# Patient Record
Sex: Female | Born: 1955 | Race: Black or African American | Hispanic: No | Marital: Single | Smoking: Former smoker
Health system: Southern US, Community
[De-identification: ages and names within clinical notes are randomized; demographics above are authoritative.]

## PROBLEM LIST (undated history)

## (undated) DIAGNOSIS — E785 Hyperlipidemia, unspecified: Secondary | ICD-10-CM

## (undated) HISTORY — DX: Hyperlipidemia, unspecified: E78.5

---

## 2010-09-09 ENCOUNTER — Other Ambulatory Visit: Payer: Self-pay | Admitting: Family Medicine

## 2010-09-09 DIAGNOSIS — Z1239 Encounter for other screening for malignant neoplasm of breast: Secondary | ICD-10-CM

## 2010-09-18 ENCOUNTER — Ambulatory Visit
Admission: RE | Admit: 2010-09-18 | Discharge: 2010-09-18 | Disposition: A | Discharge status: Home / Self Care | Payer: Private Health Insurance - Indemnity | Source: Ambulatory Visit | Attending: Family Medicine | Admitting: Family Medicine

## 2010-09-18 DIAGNOSIS — Z1239 Encounter for other screening for malignant neoplasm of breast: Secondary | ICD-10-CM

## 2011-03-12 ENCOUNTER — Encounter: Payer: Self-pay | Admitting: Gastroenterology

## 2011-04-21 ENCOUNTER — Other Ambulatory Visit: Payer: Private Health Insurance - Indemnity | Admitting: Gastroenterology

## 2011-04-23 ENCOUNTER — Ambulatory Visit (AMBULATORY_SURGERY_CENTER): Payer: Private Health Insurance - Indemnity

## 2011-04-23 VITALS — Ht 62.0 in | Wt 154.1 lb

## 2011-04-23 DIAGNOSIS — Z1211 Encounter for screening for malignant neoplasm of colon: Secondary | ICD-10-CM

## 2011-04-23 MED ORDER — SUPREP BOWEL PREP KIT 17.5-3.13-1.6 GM/177ML PO SOLN: 1.0000 | Freq: Once | ORAL | Status: DC

## 2011-04-26 ENCOUNTER — Encounter: Payer: Self-pay | Admitting: Gastroenterology

## 2011-05-07 ENCOUNTER — Ambulatory Visit (AMBULATORY_SURGERY_CENTER): Payer: Private Health Insurance - Indemnity | Admitting: Gastroenterology

## 2011-05-07 ENCOUNTER — Encounter: Payer: Self-pay | Admitting: Gastroenterology

## 2011-05-07 VITALS — BP 117/76 | HR 83 | Temp 98.1°F | Resp 18 | Ht 62.0 in | Wt 154.0 lb

## 2011-05-07 DIAGNOSIS — Z1211 Encounter for screening for malignant neoplasm of colon: Secondary | ICD-10-CM

## 2011-05-07 MED ORDER — SODIUM CHLORIDE 0.9 % IV SOLN: 500.0000 mL | INTRAVENOUS | Status: DC

## 2011-05-07 NOTE — Patient Instructions (Signed)
Discharge instructions given with verbal understanding. Normal. Resume previous medications. 

## 2011-05-10 ENCOUNTER — Telehealth: Payer: Self-pay

## 2011-05-10 NOTE — Telephone Encounter (Signed)
No ID on answering machine. 

## 2011-10-13 ENCOUNTER — Other Ambulatory Visit: Payer: Self-pay | Admitting: Family Medicine

## 2011-10-13 DIAGNOSIS — Z1231 Encounter for screening mammogram for malignant neoplasm of breast: Secondary | ICD-10-CM

## 2011-10-26 ENCOUNTER — Ambulatory Visit
Admission: RE | Admit: 2011-10-26 | Discharge: 2011-10-26 | Disposition: A | Discharge status: Home / Self Care | Payer: Private Health Insurance - Indemnity | Source: Ambulatory Visit | Attending: Family Medicine | Admitting: Family Medicine

## 2011-10-26 DIAGNOSIS — Z1231 Encounter for screening mammogram for malignant neoplasm of breast: Secondary | ICD-10-CM

## 2012-12-04 ENCOUNTER — Other Ambulatory Visit: Payer: Self-pay

## 2012-12-04 DIAGNOSIS — Z1231 Encounter for screening mammogram for malignant neoplasm of breast: Secondary | ICD-10-CM

## 2012-12-27 ENCOUNTER — Ambulatory Visit
Admission: RE | Admit: 2012-12-27 | Discharge: 2012-12-27 | Disposition: A | Discharge status: Home / Self Care | Payer: BC Managed Care – PPO | Source: Ambulatory Visit

## 2012-12-27 DIAGNOSIS — Z1231 Encounter for screening mammogram for malignant neoplasm of breast: Secondary | ICD-10-CM

## 2014-01-01 ENCOUNTER — Other Ambulatory Visit: Payer: Self-pay

## 2014-01-01 DIAGNOSIS — Z1231 Encounter for screening mammogram for malignant neoplasm of breast: Secondary | ICD-10-CM

## 2014-01-03 ENCOUNTER — Ambulatory Visit
Admission: RE | Admit: 2014-01-03 | Discharge: 2014-01-03 | Disposition: A | Discharge status: Home / Self Care | Payer: BC Managed Care – PPO | Source: Ambulatory Visit

## 2014-01-03 DIAGNOSIS — Z1231 Encounter for screening mammogram for malignant neoplasm of breast: Secondary | ICD-10-CM

## 2014-12-25 ENCOUNTER — Other Ambulatory Visit: Payer: Self-pay

## 2014-12-25 DIAGNOSIS — Z1231 Encounter for screening mammogram for malignant neoplasm of breast: Secondary | ICD-10-CM

## 2015-01-22 ENCOUNTER — Ambulatory Visit
Admission: RE | Admit: 2015-01-22 | Discharge: 2015-01-22 | Disposition: A | Discharge status: Home / Self Care | Payer: BLUE CROSS/BLUE SHIELD | Source: Ambulatory Visit

## 2015-01-22 DIAGNOSIS — Z1231 Encounter for screening mammogram for malignant neoplasm of breast: Secondary | ICD-10-CM

## 2016-01-13 ENCOUNTER — Other Ambulatory Visit: Payer: Self-pay

## 2016-01-13 DIAGNOSIS — Z1231 Encounter for screening mammogram for malignant neoplasm of breast: Secondary | ICD-10-CM

## 2016-01-23 ENCOUNTER — Ambulatory Visit: Payer: BLUE CROSS/BLUE SHIELD

## 2016-01-23 ENCOUNTER — Ambulatory Visit
Admission: RE | Admit: 2016-01-23 | Discharge: 2016-01-23 | Disposition: A | Discharge status: Home / Self Care | Payer: 59 | Source: Ambulatory Visit

## 2016-01-23 DIAGNOSIS — Z1231 Encounter for screening mammogram for malignant neoplasm of breast: Secondary | ICD-10-CM

## 2017-03-10 ENCOUNTER — Other Ambulatory Visit: Payer: Self-pay | Admitting: Family Medicine

## 2017-03-10 DIAGNOSIS — Z1231 Encounter for screening mammogram for malignant neoplasm of breast: Secondary | ICD-10-CM

## 2017-03-28 ENCOUNTER — Ambulatory Visit: Payer: 59

## 2017-04-07 ENCOUNTER — Ambulatory Visit
Admission: RE | Admit: 2017-04-07 | Discharge: 2017-04-07 | Disposition: A | Discharge status: Home / Self Care | Payer: 59 | Source: Ambulatory Visit | Attending: Family Medicine | Admitting: Family Medicine

## 2017-04-07 DIAGNOSIS — Z1231 Encounter for screening mammogram for malignant neoplasm of breast: Secondary | ICD-10-CM

## 2018-07-19 ENCOUNTER — Other Ambulatory Visit: Payer: Self-pay | Admitting: Family Medicine

## 2018-07-19 DIAGNOSIS — Z1231 Encounter for screening mammogram for malignant neoplasm of breast: Secondary | ICD-10-CM

## 2018-07-27 ENCOUNTER — Ambulatory Visit
Admission: RE | Admit: 2018-07-27 | Discharge: 2018-07-27 | Disposition: A | Discharge status: Home / Self Care | Payer: 59 | Source: Ambulatory Visit | Attending: Family Medicine | Admitting: Family Medicine

## 2018-07-27 DIAGNOSIS — Z1231 Encounter for screening mammogram for malignant neoplasm of breast: Secondary | ICD-10-CM

## 2019-06-22 ENCOUNTER — Other Ambulatory Visit: Payer: Self-pay | Admitting: Family Medicine

## 2019-06-22 DIAGNOSIS — Z1231 Encounter for screening mammogram for malignant neoplasm of breast: Secondary | ICD-10-CM

## 2019-08-27 ENCOUNTER — Ambulatory Visit
Admission: RE | Admit: 2019-08-27 | Discharge: 2019-08-27 | Disposition: A | Discharge status: Home / Self Care | Payer: 59 | Source: Ambulatory Visit | Attending: Family Medicine | Admitting: Family Medicine

## 2019-08-27 ENCOUNTER — Other Ambulatory Visit: Payer: Self-pay

## 2019-08-27 DIAGNOSIS — Z1231 Encounter for screening mammogram for malignant neoplasm of breast: Secondary | ICD-10-CM

## 2019-11-01 ENCOUNTER — Ambulatory Visit: Payer: 59 | Attending: Family

## 2019-11-01 DIAGNOSIS — Z23 Encounter for immunization: Secondary | ICD-10-CM

## 2019-11-01 NOTE — Progress Notes (Signed)
   Covid-19 Vaccination Clinic  Name:  Bethany Bush    MRN: [id number] DOB: [birth year]  11/01/2019  [name] was observed post Covid-19 immunization for 15 minutes without incident. She was provided with Vaccine Information Sheet and instruction to access the V-Safe system.   [name] was instructed to call 911 with any severe reactions post vaccine: Marland Kitchen Difficulty breathing  . Swelling of face and throat  . A fast heartbeat  . A bad rash all over body  . Dizziness and weakness   Immunizations Administered    Name Date Dose VIS Date Route   Moderna COVID-19 Vaccine 11/01/2019  3:16 PM 0.5 mL 07/17/2019 Intramuscular   Manufacturer: Moderna   Lot: [id number]   NDC: 48350-757-32

## 2019-12-04 ENCOUNTER — Ambulatory Visit: Payer: 59 | Attending: Family

## 2019-12-04 DIAGNOSIS — Z23 Encounter for immunization: Secondary | ICD-10-CM

## 2019-12-04 NOTE — Progress Notes (Signed)
   Covid-19 Vaccination Clinic  Name:  Bethany Bush    MRN: [id number] DOB: 04/15/1956  12/04/2019  [name] was observed post Covid-19 immunization for 15 minutes without incident. She was provided with Vaccine Information Sheet and instruction to access the V-Safe system.   [name] was instructed to call 911 with any severe reactions post vaccine: Marland Kitchen Difficulty breathing  . Swelling of face and throat  . A fast heartbeat  . A bad rash all over body  . Dizziness and weakness   Immunizations Administered    Name Date Dose VIS Date Route   Moderna COVID-19 Vaccine 12/04/2019  2:08 PM 0.5 mL 07/2019 Intramuscular   Manufacturer: Moderna   Lot: [id number]   NDC: 90903-014-99

## 2020-09-22 ENCOUNTER — Other Ambulatory Visit: Payer: Self-pay | Admitting: Family Medicine

## 2020-09-22 DIAGNOSIS — Z1231 Encounter for screening mammogram for malignant neoplasm of breast: Secondary | ICD-10-CM

## 2020-11-07 ENCOUNTER — Ambulatory Visit: Payer: 59

## 2021-01-20 ENCOUNTER — Ambulatory Visit
Admission: RE | Admit: 2021-01-20 | Discharge: 2021-01-20 | Disposition: A | Discharge status: Home / Self Care | Payer: 59 | Source: Ambulatory Visit | Attending: Family Medicine | Admitting: Family Medicine

## 2021-01-20 ENCOUNTER — Other Ambulatory Visit: Payer: Self-pay

## 2021-01-20 DIAGNOSIS — Z1231 Encounter for screening mammogram for malignant neoplasm of breast: Secondary | ICD-10-CM

## 2021-01-24 IMAGING — MG DIGITAL SCREENING BILAT W/ TOMO W/ CAD
8 series · 8 of 24 positions shown · non-contrast
Comparison: Previous exam(s).

CLINICAL DATA: Screening.

EXAM:
DIGITAL SCREENING BILATERAL MAMMOGRAM WITH TOMO AND CAD

[R CC synth-2D]
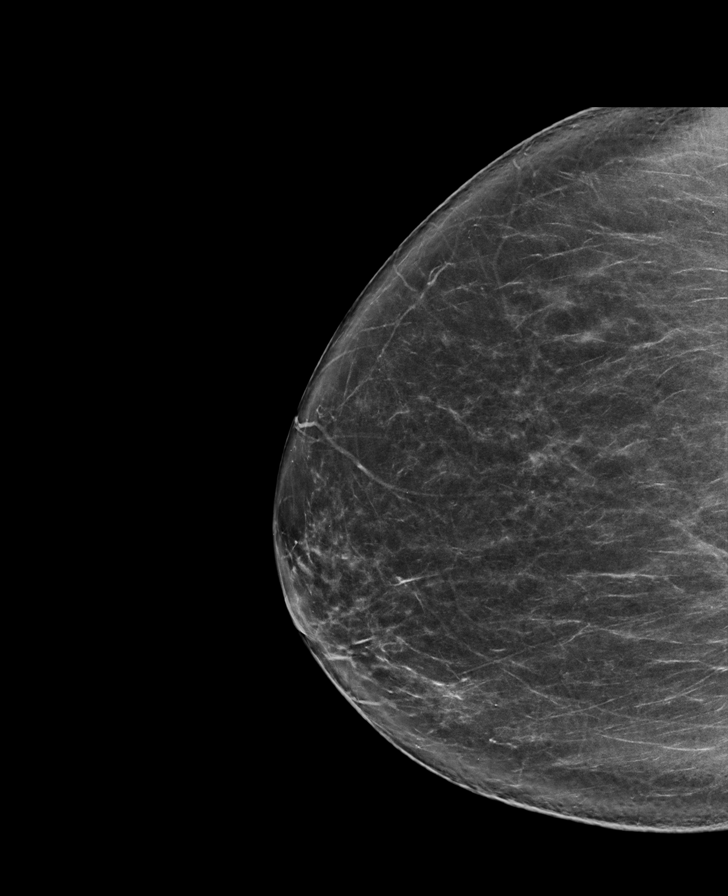

[L CC synth-2D]
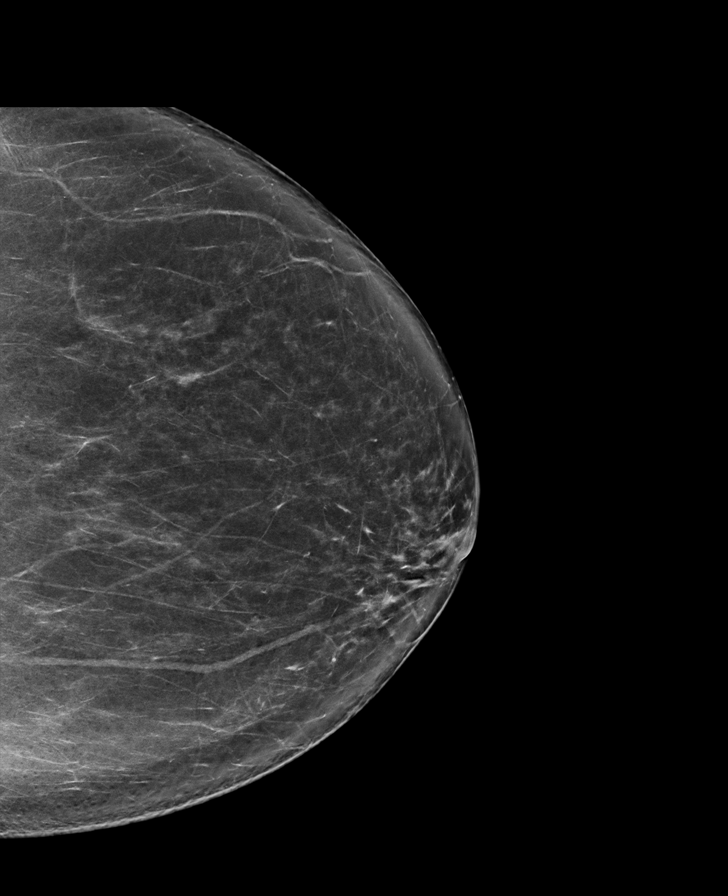

[L MLO synth-2D]
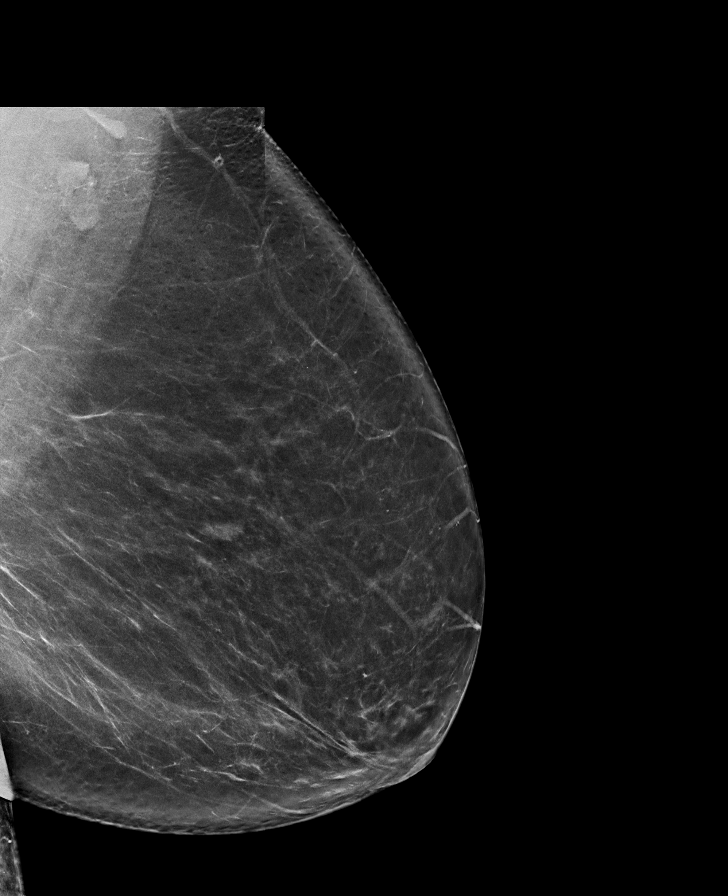

[R MLO synth-2D]
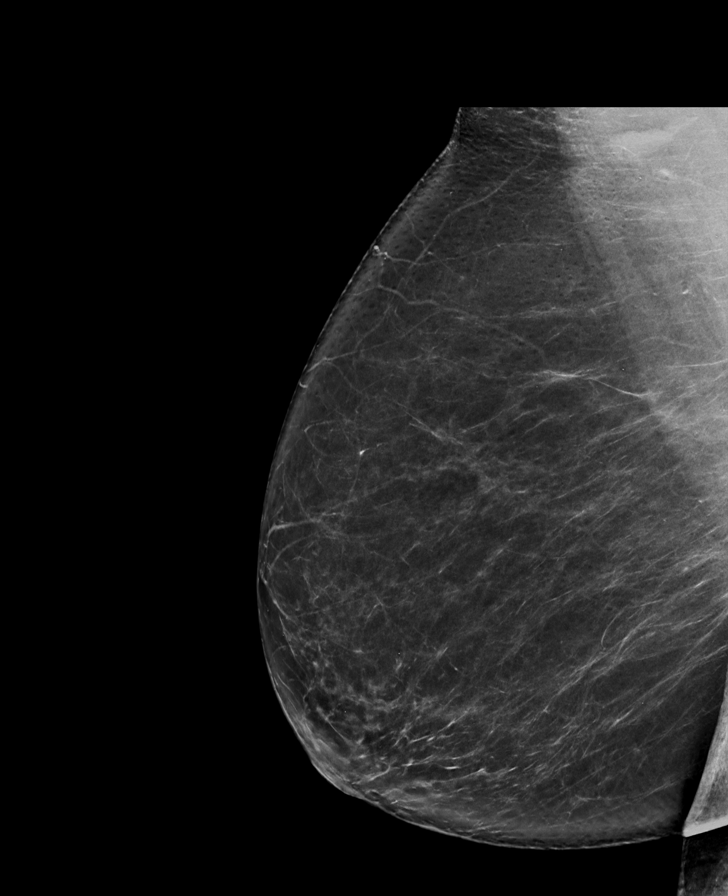

[L CC tomo · tomo slice 41/82.0]
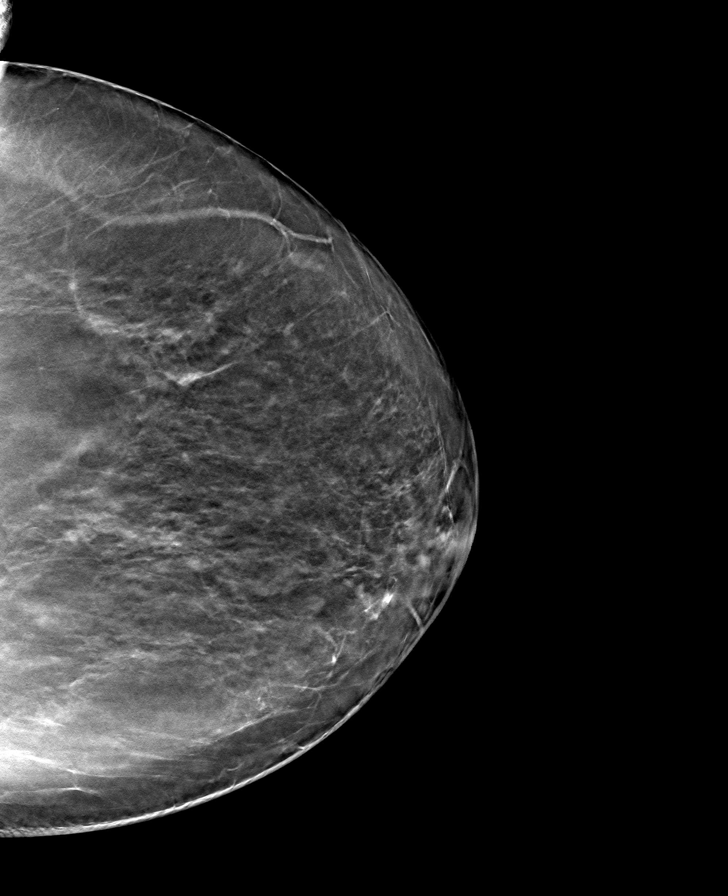

[L MLO tomo · tomo slice 47/92.0]
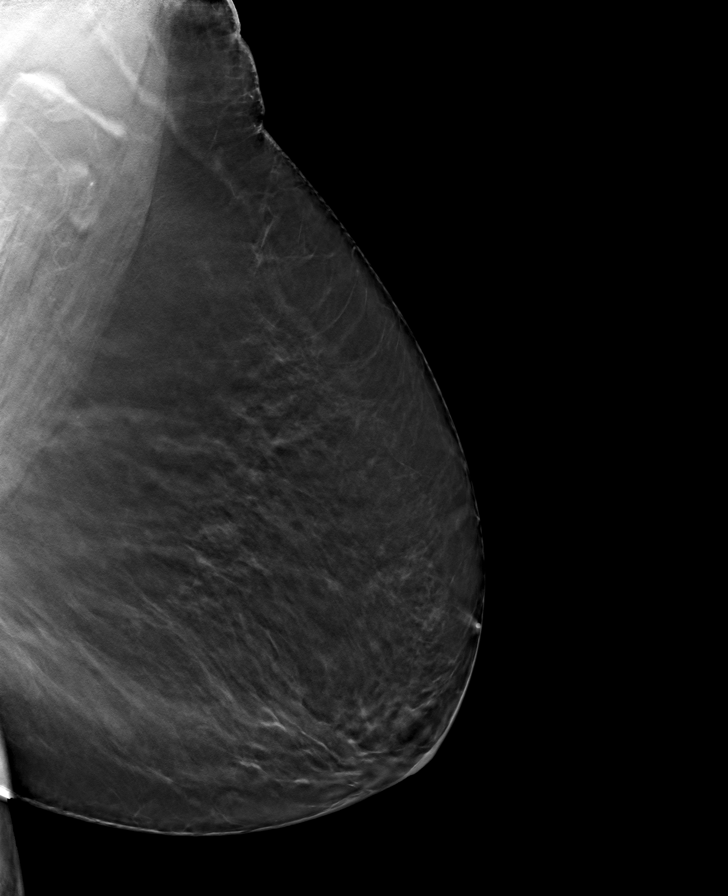

[R CC tomo · tomo slice 43/85.0]
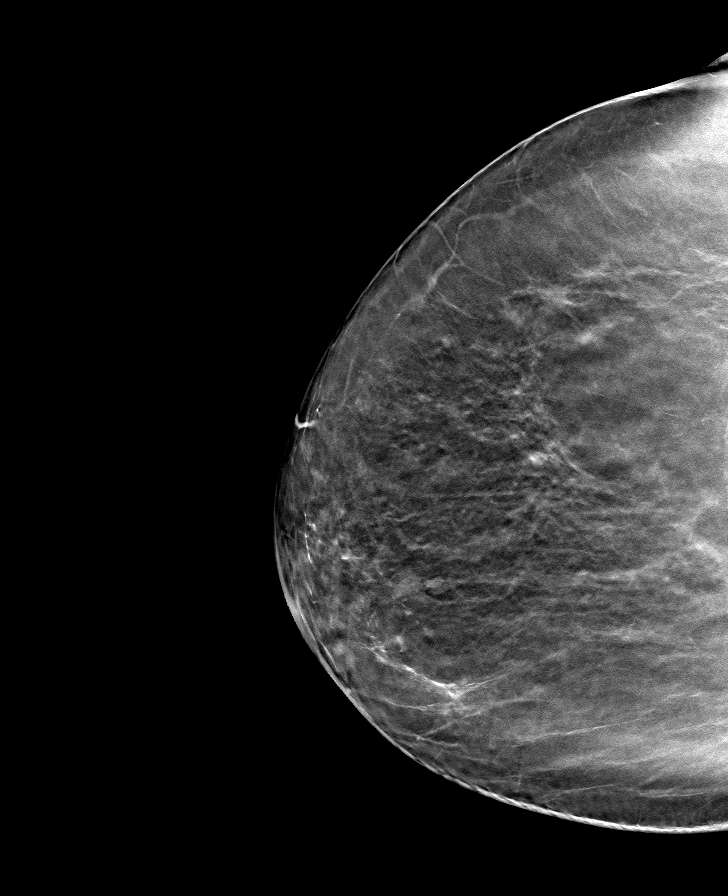

[R MLO tomo · tomo slice 47/92.0]
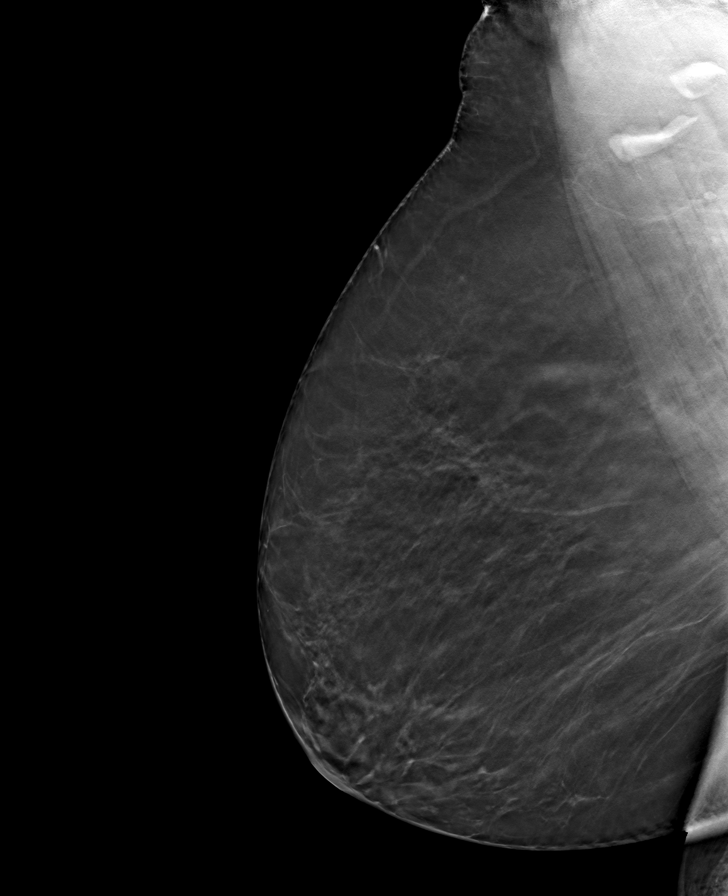

[8 of 24 positions shown; findings below may reference images not displayed]

ACR Breast Density Category b: There are scattered areas of
fibroglandular density.
FINDINGS: There are no findings suspicious for malignancy. Images were
processed with CAD.
IMPRESSION: No mammographic evidence of malignancy. A result letter of this
screening mammogram will be mailed directly to the patient.

RECOMMENDATION:
Screening mammogram in one year. (Code:[id number])

BI-RADS CATEGORY  1: Negative.

## 2021-03-12 ENCOUNTER — Other Ambulatory Visit: Payer: Self-pay | Admitting: Family Medicine

## 2021-03-12 ENCOUNTER — Ambulatory Visit
Admission: RE | Admit: 2021-03-12 | Discharge: 2021-03-12 | Disposition: A | Discharge status: Home / Self Care | Payer: 59 | Source: Ambulatory Visit | Attending: Family Medicine | Admitting: Family Medicine

## 2021-03-12 DIAGNOSIS — R52 Pain, unspecified: Secondary | ICD-10-CM

## 2021-05-26 ENCOUNTER — Encounter: Payer: Self-pay | Admitting: Gastroenterology

## 2022-05-13 ENCOUNTER — Other Ambulatory Visit: Payer: Self-pay | Admitting: Family Medicine

## 2022-05-13 DIAGNOSIS — Z1231 Encounter for screening mammogram for malignant neoplasm of breast: Secondary | ICD-10-CM

## 2022-05-24 ENCOUNTER — Ambulatory Visit
Admission: RE | Admit: 2022-05-24 | Discharge: 2022-05-24 | Disposition: A | Discharge status: Home / Self Care | Payer: 59 | Source: Ambulatory Visit | Attending: Family Medicine | Admitting: Family Medicine

## 2022-05-24 DIAGNOSIS — Z1231 Encounter for screening mammogram for malignant neoplasm of breast: Secondary | ICD-10-CM

## 2024-08-30 ENCOUNTER — Other Ambulatory Visit: Payer: Self-pay | Admitting: Family Medicine

## 2024-08-30 DIAGNOSIS — N644 Mastodynia: Secondary | ICD-10-CM

## 2024-08-30 DIAGNOSIS — Z1231 Encounter for screening mammogram for malignant neoplasm of breast: Secondary | ICD-10-CM

## 2024-09-06 ENCOUNTER — Ambulatory Visit: Admission: RE | Admit: 2024-09-06 | Source: Ambulatory Visit

## 2024-09-06 ENCOUNTER — Ambulatory Visit
Admission: RE | Admit: 2024-09-06 | Discharge: 2024-09-06 | Disposition: A | Discharge status: Home / Self Care | Source: Ambulatory Visit | Attending: Family Medicine | Admitting: Family Medicine

## 2024-09-06 DIAGNOSIS — N644 Mastodynia: Secondary | ICD-10-CM

## 2024-09-07 ENCOUNTER — Encounter: Attending: Family Medicine | Admitting: Dietician

## 2024-09-07 ENCOUNTER — Encounter: Payer: Self-pay | Admitting: Dietician

## 2024-09-07 VITALS — Ht 62.0 in | Wt 155.0 lb

## 2024-09-07 DIAGNOSIS — E119 Type 2 diabetes mellitus without complications: Secondary | ICD-10-CM | POA: Diagnosis present

## 2024-09-07 NOTE — Patient Instructions (Addendum)
 Consider falling asleep in your bed for best quality sleep.  Aim to take your medications consistently.  Determine what interferes with you taking your medication consistently.    Eye exam  Mindfulness:  Consistently scheduled meal - avoid skipping  Choices  Eat slowly  Away from distraction (sitting in kitchen or dining room)  Stop eating when satisfied  Before a snack ask, Am I hungry or eating for another reason?   What can I do instead if I am not hungry?  Try to find something every day that brings you joy!

## 2024-09-07 NOTE — Progress Notes (Signed)
 "  Diabetes Self-Management Education  Visit Type: First/Initial  Appt. Start Time: 1530 Appt. End Time: 1640  09/07/2024  [name], identified by name and date of birth, is a 69 y.o. female with a diagnosis of Diabetes: Type 2.   ASSESSMENT Patient is here today alone.  She would like to learn how to lower her cholesterol, lose a little weight, find balance in her eating and learn healthier alternatives.  She is realizing as she ages, that her taste buds are changing.  Referral:  HTN, HLD, Type 2  History includes:  Type 2 Diabetes (2012), HTN, HLD Medications include:  Toujeo 40 units q HS, Jardiance, Metformin, insomnia, HTN (patient states that she does not have HTN) Labs:  A1C 7.2% 09/05/2024 and states her last A1C was 12-14% Sleep:  most likely will sleep in her recliner rather than the bed then will wake and not be able to get back to sleep. CGM:  Libre  135 currently  CGM Results from download: 09/07/2024  % Time CGM active:   89 %   (Goal >70%)  Average glucose:   154 mg/dL for 14 days  Glucose management indicator:   7 %  Time in range (70-180 mg/dL):   80 %   (Goal >29%)  Time High (181-250 mg/dL):   20 %   (Goal < 74%)  Time Very High (>250 mg/dL):    0 %   (Goal < 5%)  Time Low (54-69 mg/dL):   0 %   (Goal <5%)  Time Very Low (<54 mg/dL):   0%   (Goal <8%)  %CV (glucose variability)     %  (Goal <36%)   62 155 lbs 09/08/2023 140-145 lbs 2012  Patient lives alone. 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 She has been walking about 20 minutes most every day but has a hard time being consistent.   She states that she likes sweets and fried foods.  She does not like to cook much anymore.  Appetite is lower. Eats dinner late (8-10 pm)  Height 5' 2 (1.575 m), weight 155 lb (70.3 kg), last menstrual period 03/16/2010. Body mass index is 28.35 kg/m.   Diabetes Self-Management Education - 09/07/24 1333       Visit Information    Visit Type First/Initial      Initial Visit   Diabetes Type Type 2    Date Diagnosed 2012    Are you currently following a meal plan? No    Are you taking your medications as prescribed? No   misses at times     Health Coping   How would you rate your overall health? Good      Psychosocial Assessment   Patient Belief/Attitude about Diabetes Motivated to manage diabetes    What is the hardest part about your diabetes right now, causing you the most concern, or is the most worrisome to you about your diabetes?   Making healty food and beverage choices    Self-care barriers None    Self-management support Doctor's office    Other persons present Patient    Patient Concerns Nutrition/Meal planning;Healthy Lifestyle;Glycemic Control;Weight Control    Special Needs None    Preferred Learning Style Hands on    Learning Readiness Ready    How often do you need to have someone help you when you read instructions, pamphlets, or other written materials from your doctor or pharmacy? 1 - Never    What  is the last grade level you completed in school? Master's degree      Pre-Education Assessment   Patient understands the diabetes disease and treatment process. Needs Review    Patient understands incorporating nutritional management into lifestyle. Needs Review    Patient undertands incorporating physical activity into lifestyle. Needs Review    Patient understands using medications safely. Needs Review    Patient understands monitoring blood glucose, interpreting and using results Needs Review    Patient understands prevention, detection, and treatment of acute complications. Needs Review    Patient understands prevention, detection, and treatment of chronic complications. Needs Review    Patient understands how to develop strategies to address psychosocial issues. Needs Review    Patient understands how to develop strategies to promote health/change behavior. Needs Review      Complications    Last HgB A1C per patient/outside source 7.2 %   per patient down from 12-14%   How often do you check your blood sugar? > 4 times/day    Fasting Blood glucose range (mg/dL) 29-870;869-820   11-859   Postprandial Blood glucose range (mg/dL) 869-820;819-799    Number of hypoglycemic episodes per month 0    Number of hyperglycemic episodes ( >200mg /dL): Weekly    Can you tell when your blood sugar is high? No    Have you had a dilated eye exam in the past 12 months? No    Have you had a dental exam in the past 12 months? Yes    Are you checking your feet? Yes    How many days per week are you checking your feet? 3      Dietary Intake   Breakfast egg sandwich with mayo on sourdough bread, orange, herb tea with splenda    Snack (morning) yogurt and nuts OR cheez its    Lunch none and does    Snack (afternoon) peanuts, dark chocolate OR yogurt and nuts OR cheez its    Dinner 1/4 pork steak, fried apples, turnip greens, cabbage, cornbread    Snack (evening) nuts, grapes    Beverage(s) water, herbal tea, black tea, occasional diet soda, Ryze mushroom coffee      Activity / Exercise   Activity / Exercise Type Light (walking / raking leaves)    How many days per week do you exercise? 5    How many minutes per day do you exercise? 20    Total minutes per week of exercise 100      Patient Education   Previous Diabetes Education Yes   when diagnosed 2012   Disease Pathophysiology Definition of diabetes, type 1 and 2, and the diagnosis of diabetes;Explored patient's options for treatment of their diabetes    Healthy Eating Role of diet in the treatment of diabetes and the relationship between the three main macronutrients and blood glucose level;Food label reading, portion sizes and measuring food.;Plate Method;Information on hints to eating out and maintain blood glucose control.;Meal options for control of blood glucose level and chronic complications.;Reviewed blood glucose goals for pre and  post meals and how to evaluate the patients' food intake on their blood glucose level.;Effects of alcohol on blood glucose and safety factors with consumption of alcohol.    Being Active Role of exercise on diabetes management, blood pressure control and cardiac health.    Medications Reviewed patients medication for diabetes, action, purpose, timing of dose and side effects.    Monitoring Taught/evaluated CGM (comment);Identified appropriate SMBG and/or A1C goals.;Daily foot exams;Yearly dilated  eye exam    Acute complications Taught prevention, symptoms, and  treatment of hypoglycemia - the 15 rule.;Discussed and identified patients' prevention, symptoms, and treatment of hyperglycemia.    Chronic complications Relationship between chronic complications and blood glucose control    Diabetes Stress and Support Identified and addressed patients feelings and concerns about diabetes;Worked with patient to identify barriers to care and solutions;Role of stress on diabetes      Individualized Goals (developed by patient)   Nutrition General guidelines for healthy choices and portions discussed    Physical Activity Exercise 5-7 days per week;30 minutes per day    Medications take my medication as prescribed    Monitoring  Consistenly use CGM    Problem Solving Eating Pattern;Addressing barriers to behavior change    Reducing Risk examine blood glucose patterns;do foot checks daily;treat hypoglycemia with 15 grams of carbs if blood glucose less than 70mg /dL      Post-Education Assessment   Patient understands the diabetes disease and treatment process. Demonstrates understanding / competency    Patient understands incorporating nutritional management into lifestyle. Needs Review    Patient undertands incorporating physical activity into lifestyle. Comprehends key points    Patient understands using medications safely. Comphrehends key points    Patient understands monitoring blood glucose,  interpreting and using results Comprehends key points    Patient understands prevention, detection, and treatment of acute complications. Comprehends key points    Patient understands prevention, detection, and treatment of chronic complications. Comprehends key points    Patient understands how to develop strategies to address psychosocial issues. Comprehends key points    Patient understands how to develop strategies to promote health/change behavior. Needs Review      Outcomes   Expected Outcomes Demonstrated interest in learning. Expect positive outcomes    Future DMSE 2 months    Program Status Not Completed          Individualized Plan for Diabetes Self-Management Training:   Learning Objective:  Patient will have a greater understanding of diabetes self-management. Patient education plan is to attend individual and/or group sessions per assessed needs and concerns.   Plan:   Patient Instructions  Consider falling asleep in your bed for best quality sleep.  Aim to take your medications consistently.  Determine what interferes with you taking your medication consistently.    Eye exam  Mindfulness:  Consistently scheduled meal - avoid skipping  Choices  Eat slowly  Away from distraction (sitting in kitchen or dining room)  Stop eating when satisfied  Before a snack ask, Am I hungry or eating for another reason?   What can I do instead if I am not hungry?  Try to find something every day that brings you joy!     Expected Outcomes:  Demonstrated interest in learning. Expect positive outcomes  Education material provided: ADA - How to Thrive: A Guide for Your Journey with Diabetes, Food label handouts, Meal plan card, Snack sheet, and Diabetes Resources  If problems or questions, patient to contact team via:  Phone  Future DSME appointment: 2 months   "

## 2024-11-09 ENCOUNTER — Encounter: Admitting: Dietician
